# Patient Record
Sex: Female | Born: 1985 | Race: White | Hispanic: No | Marital: Single | Smoking: Never smoker
Health system: Southern US, Community
[De-identification: ages and names within clinical notes are randomized; demographics above are authoritative.]

## PROBLEM LIST (undated history)

## (undated) DIAGNOSIS — T07XXXA Unspecified multiple injuries, initial encounter: Secondary | ICD-10-CM

## (undated) DIAGNOSIS — M419 Scoliosis, unspecified: Secondary | ICD-10-CM

---

## 2005-03-27 ENCOUNTER — Emergency Department: Payer: Self-pay | Admitting: Emergency Medicine

## 2005-07-14 ENCOUNTER — Emergency Department: Payer: Self-pay | Admitting: Emergency Medicine

## 2006-03-16 ENCOUNTER — Emergency Department: Payer: Self-pay | Admitting: Emergency Medicine

## 2007-10-29 IMAGING — CT CT HEAD WITHOUT CONTRAST
2 series · 16 of 30 positions shown, 20 images · non-contrast
Comparison: none

REASON FOR EXAM: Headache x6 days
COMMENTS:  LMP: Now

[Series 2: without · axial · non-contrast · 0.43mm/px · z∈[-172,-52]mm · 13 of 29 slices shown, 17 images]
[im 3/29  brain]
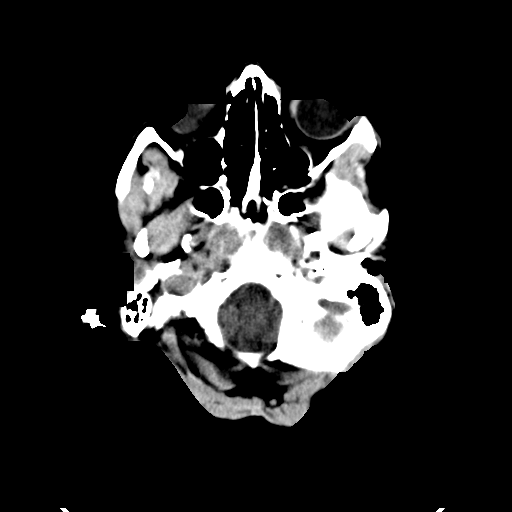
[im 3/29  bone]
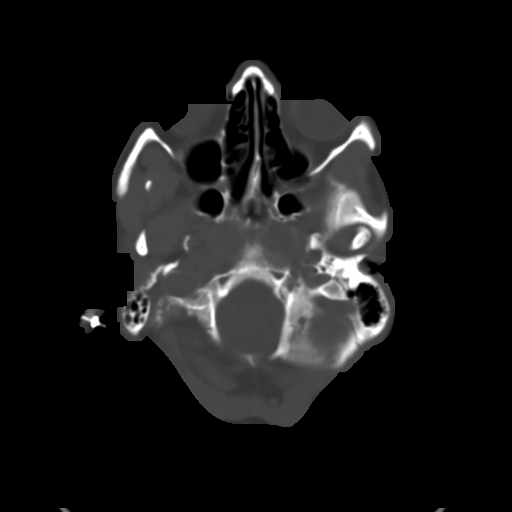
[im 5/29  brain]
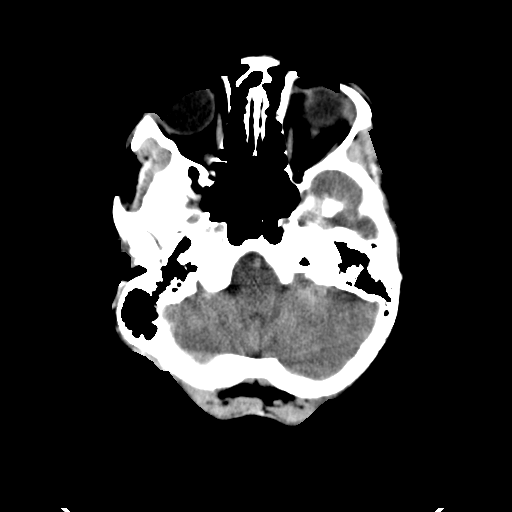
[im 7/29  brain]
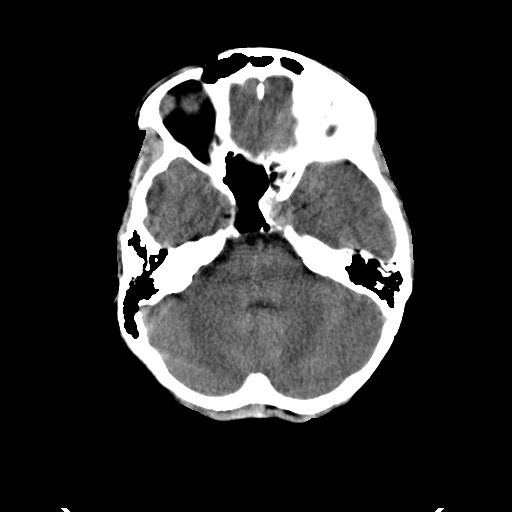
[im 9/29  brain]
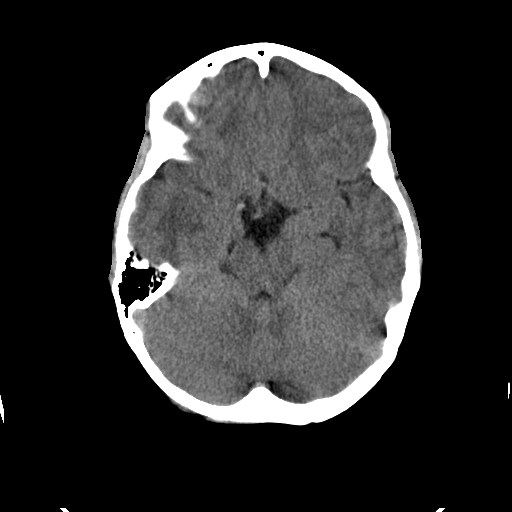
[im 11/29  brain]
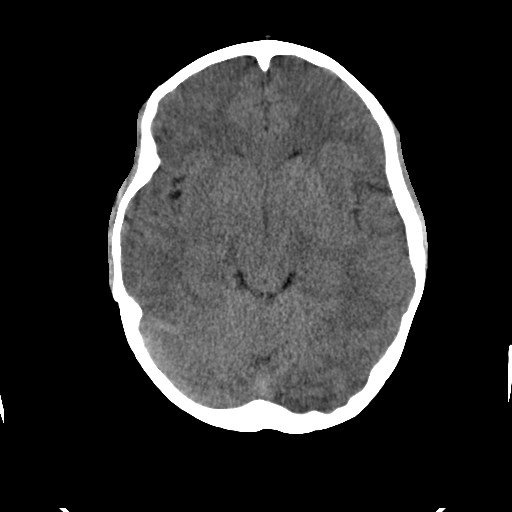
[im 11/29  bone]
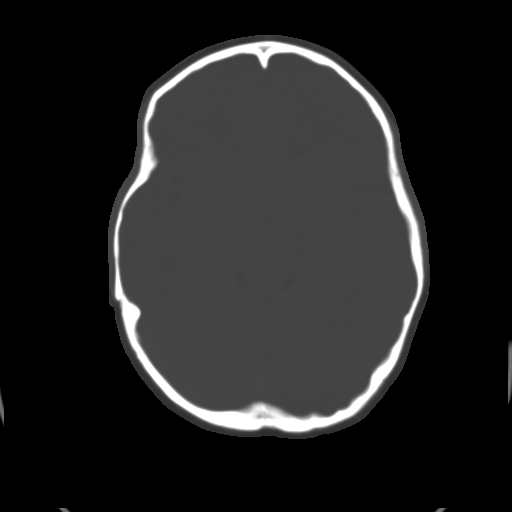
[im 13/29  brain]
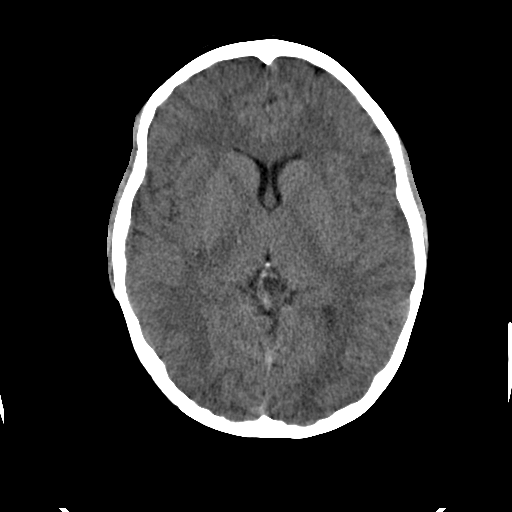
[im 15/29  brain]
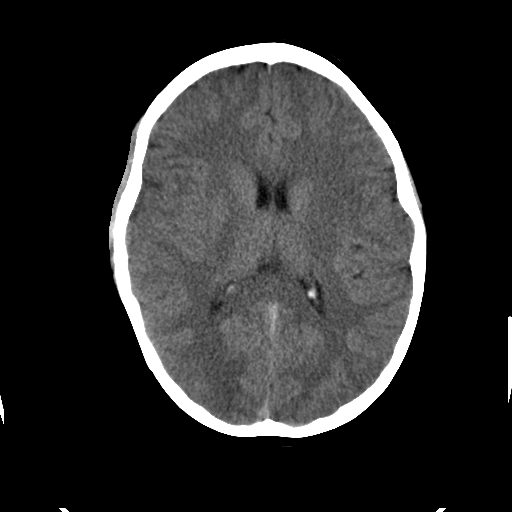
[im 17/29  brain]
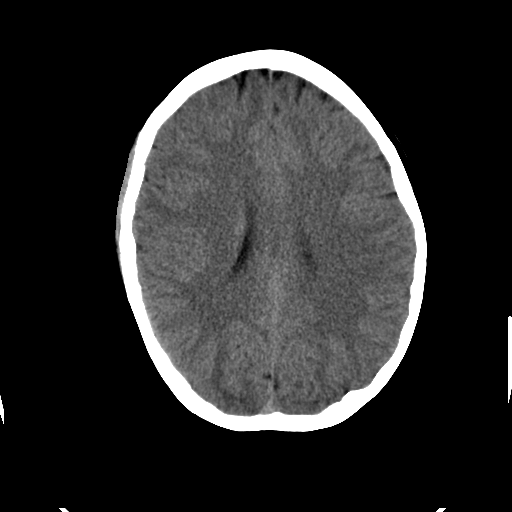
[im 19/29  brain]
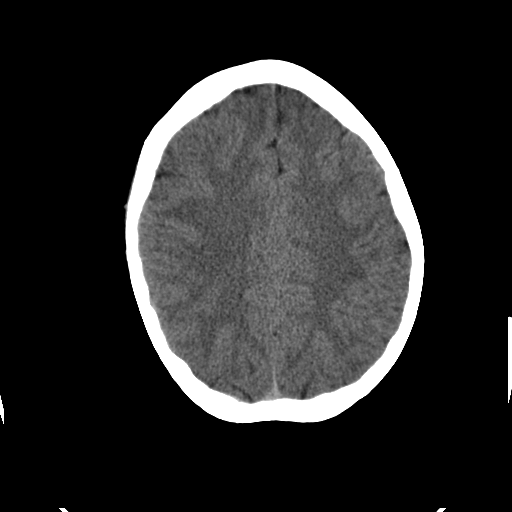
[im 19/29  bone]
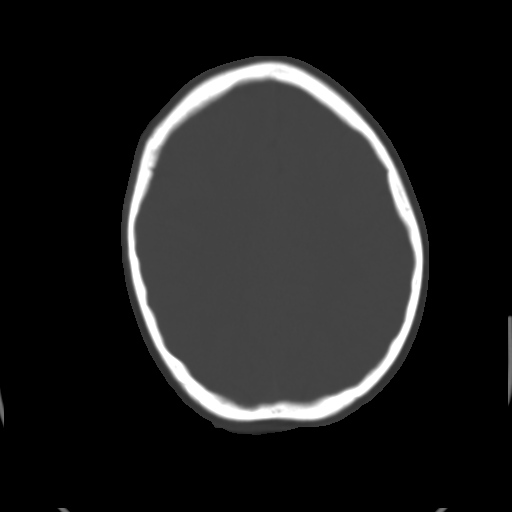
[im 21/29  brain]
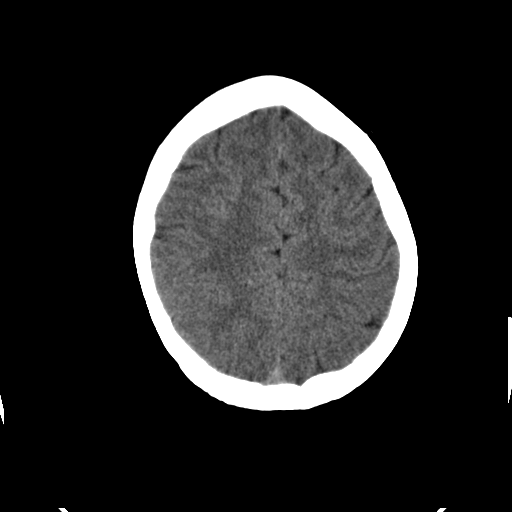
[im 23/29  brain]
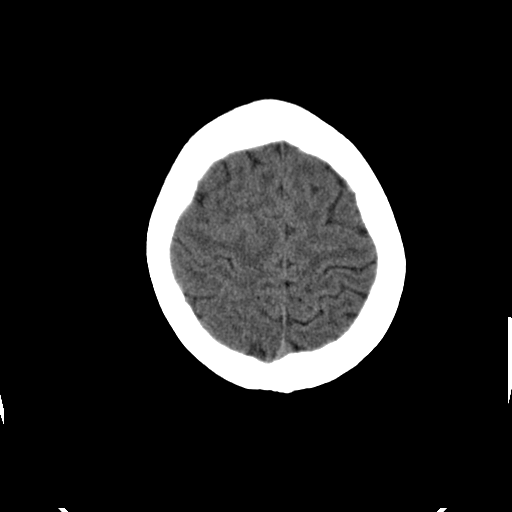
[im 25/29  brain]
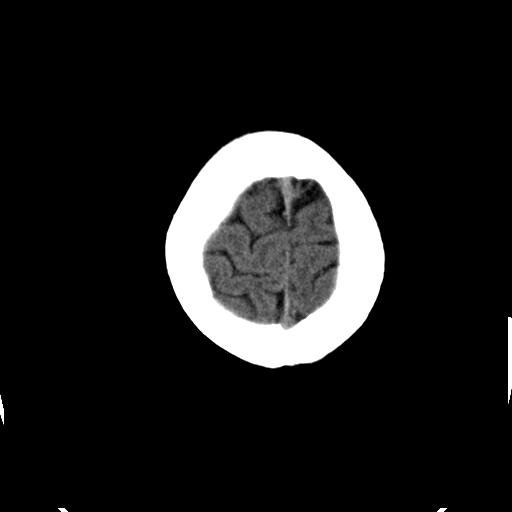
[im 27/29  brain]
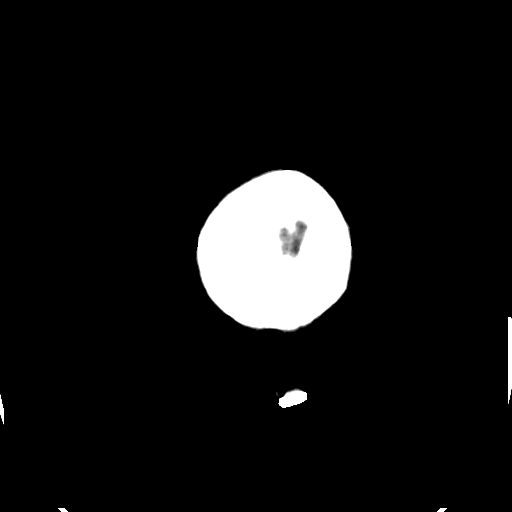
[im 27/29  bone]
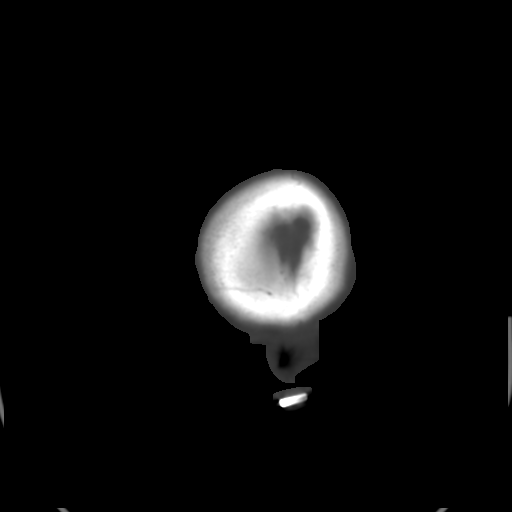

[Series 3: bone · axial · 0.43mm/px · z∈[-172,-132]mm · 3 of 29 slices shown]
[im 3/29  bone]
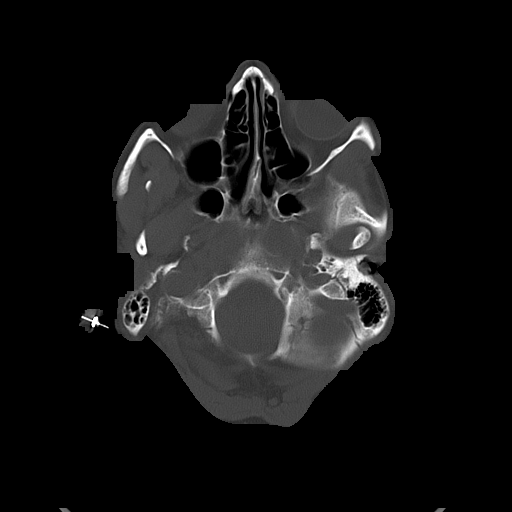
[im 7/29  bone]
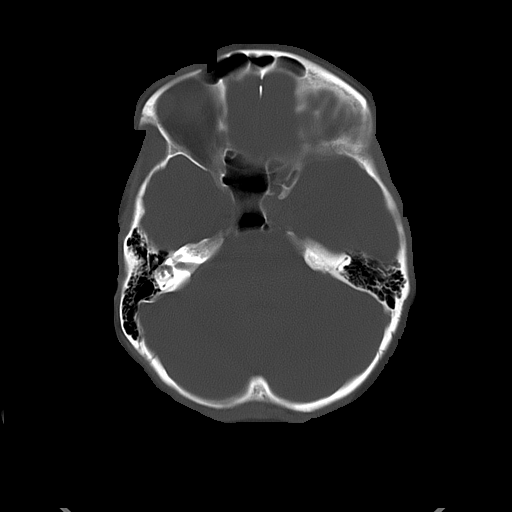
[im 11/29  bone]
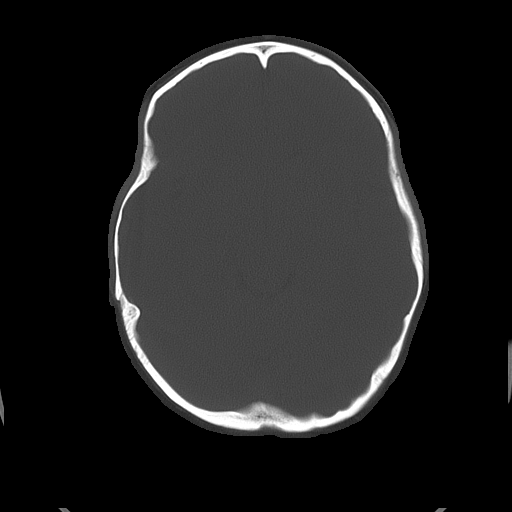

[16 of 30 positions shown; findings below may reference images not displayed]

PROCEDURE:     CT  - CT HEAD WITHOUT CONTRAST  - July 14, 2005  [DATE]

RESULT:          There is no evidence of intraaxial nor extraaxial fluid
collections nor evidence of acute hemorrhage.  No secondary signs are
appreciated to suggest mass effect, subacute or chronic infarction.  The
visualized bony skeleton evaluated with bone windowing demonstrates no
evidence of fracture or dislocation.
IMPRESSION: Unremarkable head CT as described above.

Dr. Vitaliy, of the emergency department, was informed of these findings at
the time of the initial interpretation.

## 2008-06-30 IMAGING — CR DG FOREARM 2V*L*
1 series · 2 of 2 positions shown · non-contrast
Comparison: none

REASON FOR EXAM: injury,  please include wrist,and elbow
COMMENTS:

PROCEDURE:     DXR - DXR FOREARM LEFT  - March 16, 2006  [DATE]
RESULT:     AP and lateral views of the forearm reveal no evidence of
fracture nor dislocation nor significant degenerative change. The overlying
soft tissues are normal.

[Series 1: view not recorded · 0.17mm/px · 2 of 2 slices shown]
[im 1/2]
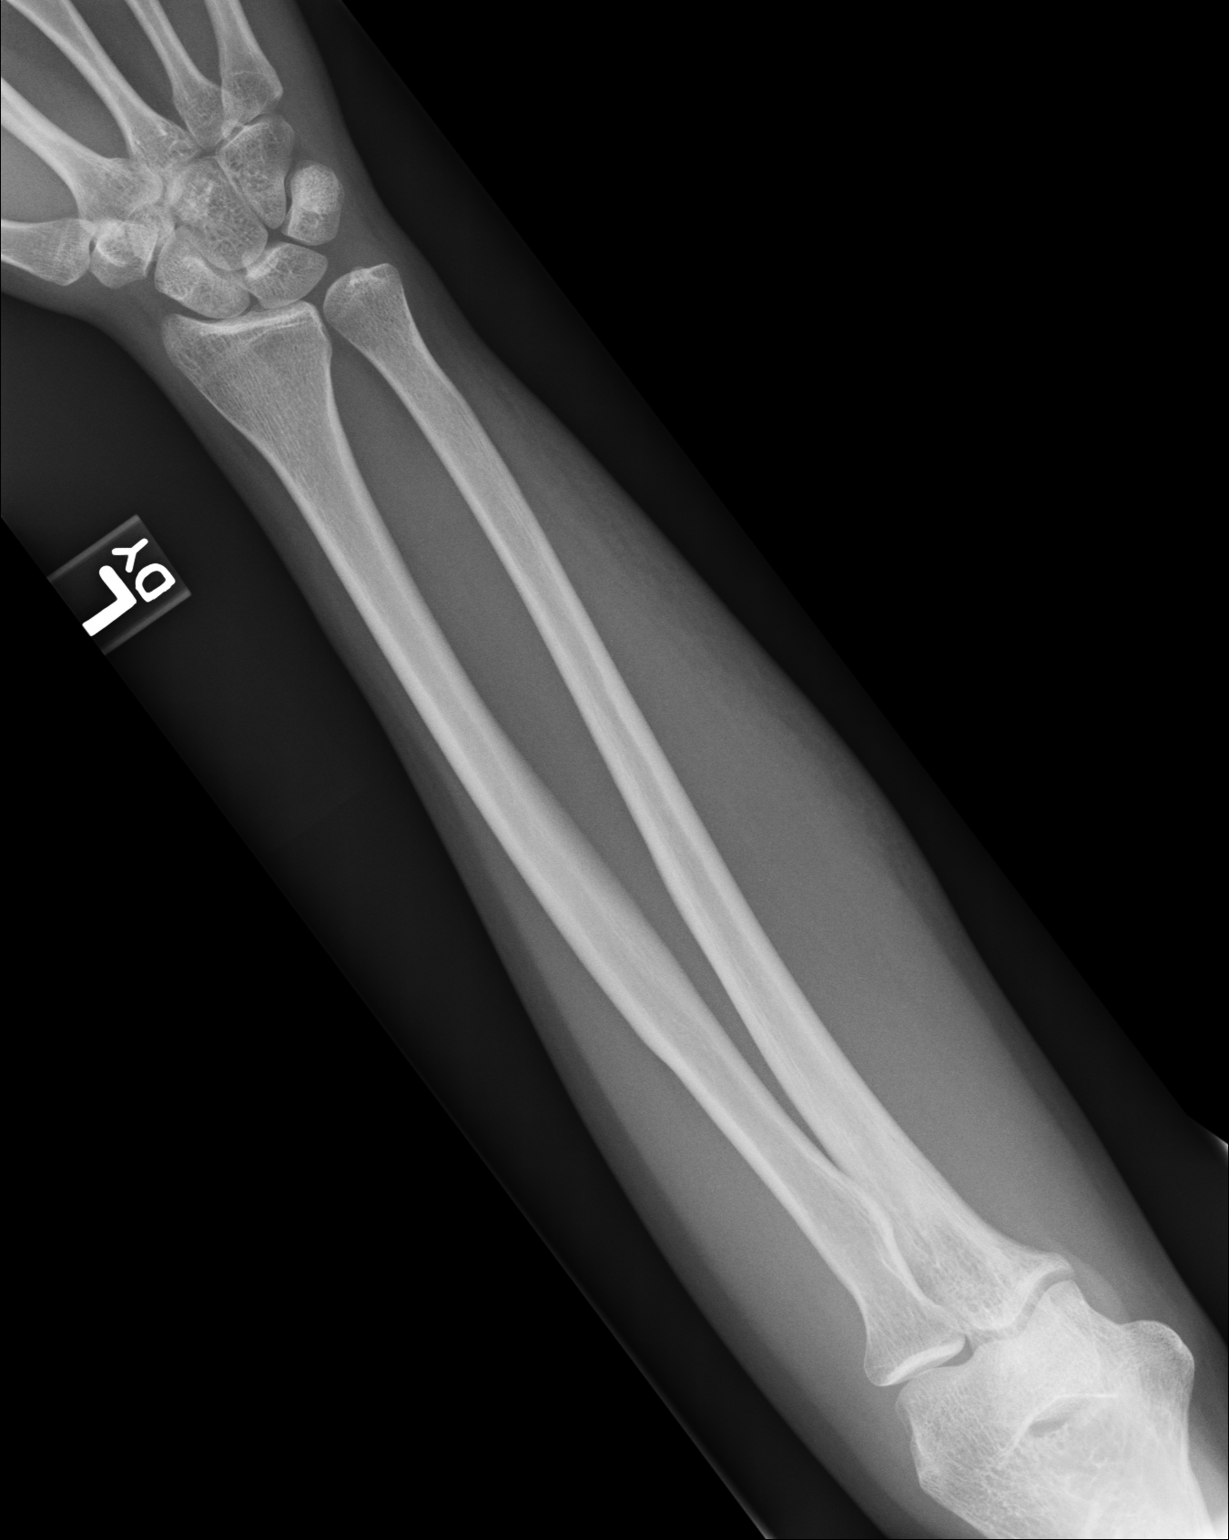
[im 2/2]
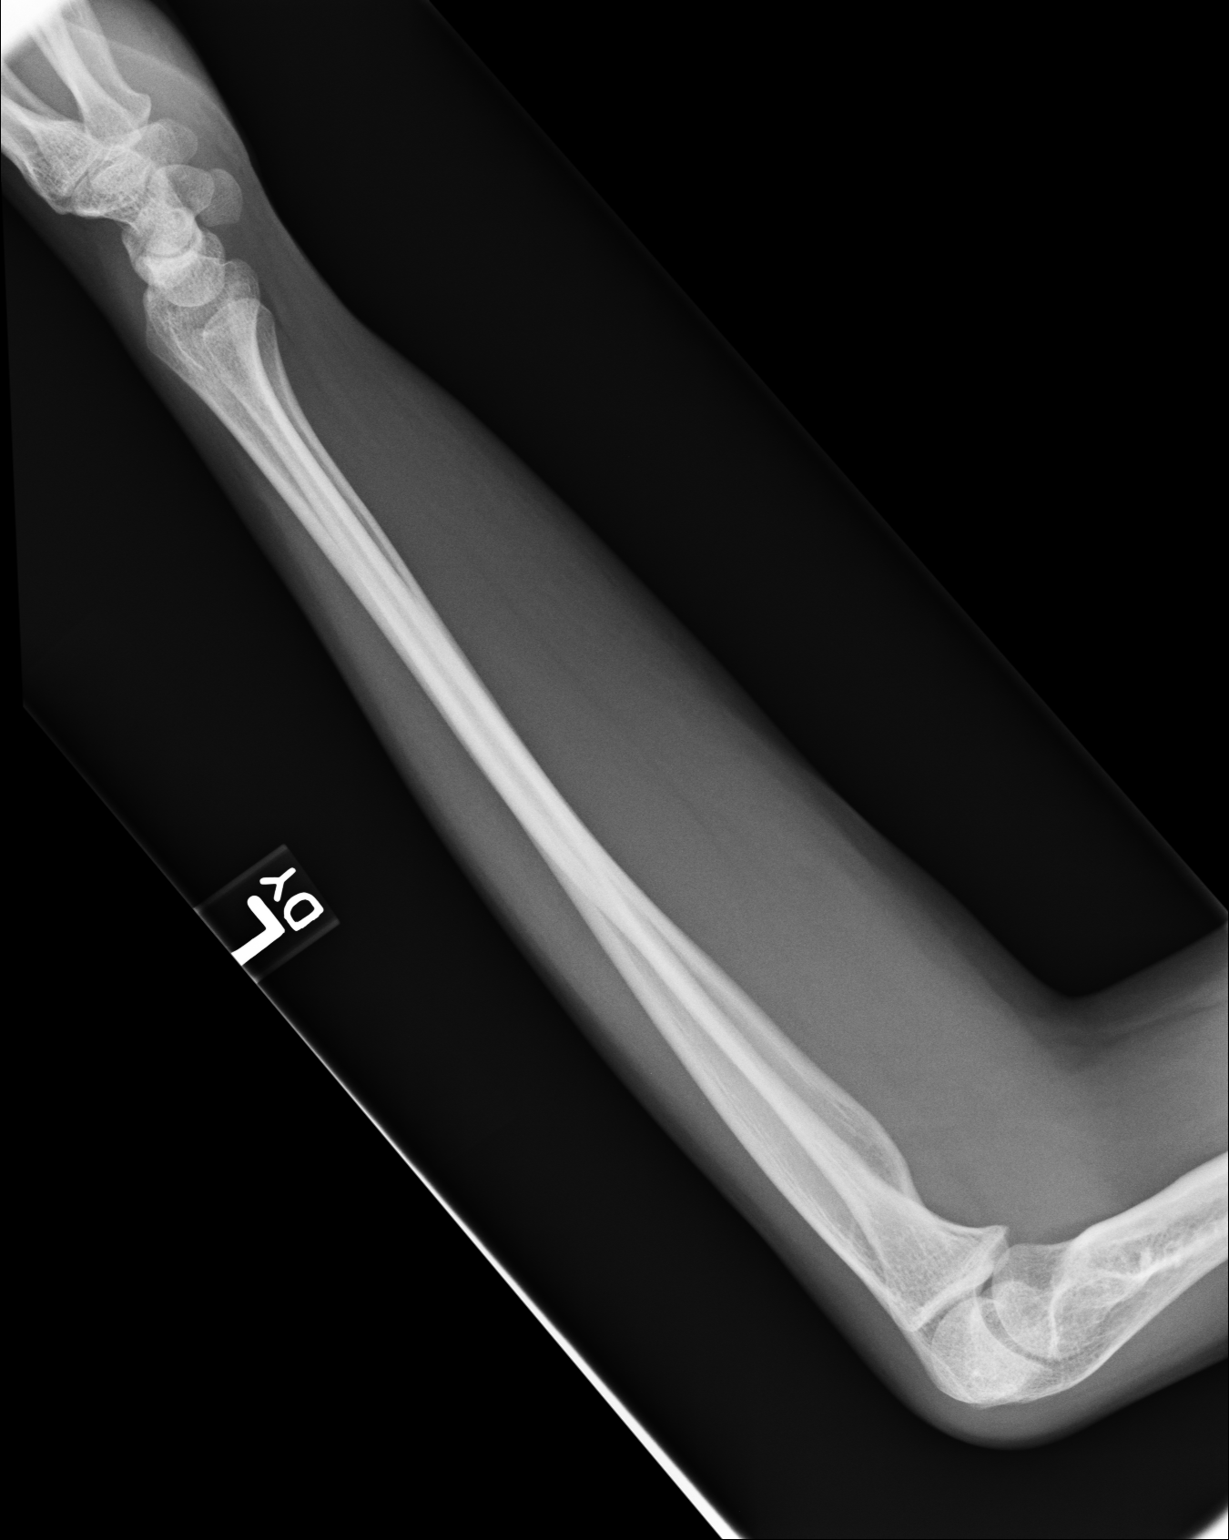

[2 of 2 positions shown; findings below may reference images not displayed]

IMPRESSION: I see no acute bony abnormality of the LEFT tibia or
fibula.

## 2014-02-22 ENCOUNTER — Encounter (HOSPITAL_COMMUNITY): Payer: Self-pay | Admitting: Emergency Medicine

## 2014-02-22 ENCOUNTER — Emergency Department (HOSPITAL_COMMUNITY)
Admission: EM | Admit: 2014-02-22 | Discharge: 2014-02-22 | Disposition: A | Discharge status: Home / Self Care | Payer: Managed Care, Other (non HMO) | Source: Home / Self Care | Attending: Family Medicine | Admitting: Family Medicine

## 2014-02-22 DIAGNOSIS — J069 Acute upper respiratory infection, unspecified: Secondary | ICD-10-CM

## 2014-02-22 HISTORY — DX: Unspecified multiple injuries, initial encounter: T07.XXXA

## 2014-02-22 HISTORY — DX: Scoliosis, unspecified: M41.9

## 2014-02-22 MED ORDER — IPRATROPIUM BROMIDE 0.06 % NA SOLN: 2.0000 | Freq: Four times a day (QID) | NASAL | Status: AC

## 2014-02-22 MED ORDER — HYDROCOD POLST-CHLORPHEN POLST 10-8 MG/5ML PO LQCR: 5.0000 mL | Freq: Two times a day (BID) | ORAL | Status: AC | PRN

## 2014-02-22 NOTE — ED Notes (Signed)
Patient complains of coughing until choking, non - productive cough.  Patient complains of sinus pressure, painful forehead.  Denies dental pain, denies fever.

## 2014-02-22 NOTE — ED Provider Notes (Signed)
CSN: [id number]     Arrival date & time 02/22/14  1431 History   First MD Initiated Contact with Patient 02/22/14 1545     Chief Complaint  Patient presents with  . URI   (Consider location/radiation/quality/duration/timing/severity/associated sxs/prior Treatment) Patient is a 28 y.o. female presenting with URI. The history is provided by the patient.  URI Presenting symptoms: congestion, cough, facial pain and rhinorrhea   Presenting symptoms: no fever   Severity:  Mild Onset quality:  Gradual Duration:  2 days Progression:  Unchanged Chronicity:  New Relieved by:  None tried Worsened by:  Nothing tried Ineffective treatments:  None tried Risk factors: no sick contacts     Past Medical History  Diagnosis Date  . Scoliosis   . Fractures    History reviewed. No pertinent past surgical history. No family history on file. History  Substance Use Topics  . Smoking status: Never Smoker   . Smokeless tobacco: Not on file  . Alcohol Use: No   OB History   Grav Para Term Preterm Abortions TAB SAB Ect Mult Living                 Review of Systems  Constitutional: Negative.  Negative for fever.  HENT: Positive for congestion, postnasal drip and rhinorrhea.   Respiratory: Positive for cough.     Allergies  Review of patient's allergies indicates no known allergies.  Home Medications   Prior to Admission medications   Medication Sig Start Date End Date Taking? Authorizing Provider  chlorpheniramine-HYDROcodone (TUSSIONEX PENNKINETIC ER) 10-8 MG/5ML LQCR Take 5 mLs by mouth every 12 (twelve) hours as needed for cough. 02/22/14   Linna HoffJames D Aniza Shor, MD  ipratropium (ATROVENT) 0.06 % nasal spray Place 2 sprays into both nostrils 4 (four) times daily. 02/22/14   Linna HoffJames D Nichole Keltner, MD   BP 117/80  Pulse 66  Temp(Src) 99.1 F (37.3 C) (Oral)  Resp 14  SpO2 100% Physical Exam  Nursing note and vitals reviewed. Constitutional: She is oriented to person, place, and time. She  appears well-developed and well-nourished.  HENT:  Head: Normocephalic.  Right Ear: External ear normal.  Left Ear: External ear normal.  Mouth/Throat: Oropharynx is clear and moist.  Eyes: Conjunctivae are normal. Pupils are equal, round, and reactive to light.  Neck: Normal range of motion. Neck supple.  Pulmonary/Chest: Effort normal and breath sounds normal.  Lymphadenopathy:    She has no cervical adenopathy.  Neurological: She is alert and oriented to person, place, and time.  Skin: Skin is warm and dry.    ED Course  Procedures (including critical care time) Labs Review Labs Reviewed - No data to display  Imaging Review No results found.   MDM   1. URI (upper respiratory infection)        Linna HoffJames D Laquinta Hazell, MD 02/22/14 1625
